# Patient Record
Sex: Female | Born: 1965 | Race: White | Hispanic: No | Marital: Single
Health system: Southern US, Community
[De-identification: ages and names within clinical notes are randomized; demographics above are authoritative.]

## PROBLEM LIST (undated history)

## (undated) DIAGNOSIS — J3089 Other allergic rhinitis: Secondary | ICD-10-CM

## (undated) DIAGNOSIS — F329 Major depressive disorder, single episode, unspecified: Secondary | ICD-10-CM

## (undated) DIAGNOSIS — F32A Depression, unspecified: Secondary | ICD-10-CM

## (undated) DIAGNOSIS — E785 Hyperlipidemia, unspecified: Secondary | ICD-10-CM

## (undated) HISTORY — PX: TONSILLECTOMY: SUR1361

---
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
## 2014-05-21 ENCOUNTER — Ambulatory Visit (HOSPITAL_COMMUNITY): Payer: Self-pay | Admitting: Physician Assistant

## 2014-06-04 ENCOUNTER — Ambulatory Visit (HOSPITAL_COMMUNITY): Payer: Self-pay | Admitting: Physician Assistant

## 2018-12-07 ENCOUNTER — Emergency Department
Admission: EM | Admit: 2018-12-07 | Discharge: 2018-12-07 | Disposition: A | Discharge status: Home / Self Care | Payer: 59 | Source: Home / Self Care | Attending: Family Medicine | Admitting: Family Medicine

## 2018-12-07 ENCOUNTER — Other Ambulatory Visit: Payer: Self-pay

## 2018-12-07 ENCOUNTER — Encounter: Payer: Self-pay | Admitting: Emergency Medicine

## 2018-12-07 ENCOUNTER — Emergency Department (INDEPENDENT_AMBULATORY_CARE_PROVIDER_SITE_OTHER): Payer: 59

## 2018-12-07 DIAGNOSIS — M79641 Pain in right hand: Secondary | ICD-10-CM

## 2018-12-07 DIAGNOSIS — M778 Other enthesopathies, not elsewhere classified: Secondary | ICD-10-CM | POA: Diagnosis not present

## 2018-12-07 HISTORY — DX: Hyperlipidemia, unspecified: E78.5

## 2018-12-07 HISTORY — DX: Other allergic rhinitis: J30.89

## 2018-12-07 HISTORY — DX: Depression, unspecified: F32.A

## 2018-12-07 MED ORDER — PREDNISONE 20 MG PO TABS: ORAL_TABLET | ORAL | 0 refills | Status: AC

## 2018-12-07 NOTE — ED Provider Notes (Signed)
[facility]    CSN: [id number] Arrival date & time: 12/07/18  0908      History   Chief Complaint Chief Complaint  Patient presents with  . Hand Injury    right    HPI Gina Goodman is a 53 y.o. female.   Patient has developed vague pain in the dorsum of her right hand recently.  Yesterday while twisting on the gasoline cap of her car, she felt increased pain in the dorsum of her right hand that has persisted.    The history is provided by the patient.  Hand Pain This is a new problem. The current episode started yesterday. The problem occurs constantly. The problem has been gradually worsening. Exacerbated by: movement of fingers. Nothing relieves the symptoms. Treatments tried: Ibuprofen 400mg . The treatment provided mild relief.    Past Medical History:  Diagnosis Date  . Depression   . Environmental and seasonal allergies   . Hyperlipidemia     There are no active problems to display for this patient.   Past Surgical History:  Procedure Laterality Date  . CESAREAN SECTION    . TONSILLECTOMY      OB History   No obstetric history on file.      Home Medications    Prior to Admission medications   Medication Sig Start Date End Date Taking? Authorizing Provider  atorvastatin (LIPITOR) 10 MG tablet Take 20 mg by mouth daily.   Yes [provider]  busPIRone (BUSPAR) 10 MG tablet Take 10 mg by mouth 3 (three) times daily.   Yes [provider]  fexofenadine (ALLEGRA) 60 MG tablet Take 60 mg by mouth 2 (two) times daily.   Yes [provider]  meloxicam (MOBIC) 7.5 MG tablet Take 7.5 mg by mouth daily.   Yes [provider]  sertraline (ZOLOFT) 100 MG tablet Take 100 mg by mouth daily.   Yes [provider]  predniSONE (DELTASONE) 20 MG tablet Take one tab by mouth twice daily for 4 days, then one daily for 3 days. Take with food. 12/07/18   Kandra Nicolas, MD    Family History No family history  on file.  Social History Social History   Tobacco Use  . Smoking status: Not on file  Substance Use Topics  . Alcohol use: Not on file  . Drug use: Not on file     Allergies   Septra [sulfamethoxazole-trimethoprim]   Review of Systems Review of Systems  Constitutional: Negative.   Musculoskeletal:       Right dorsal hand pain.  Skin: Negative for color change, rash and wound.  All other systems reviewed and are negative.    Physical Exam Triage Vital Signs ED Triage Vitals  Enc Vitals Group     BP 12/07/18 1021 (!) 150/84     Pulse Rate 12/07/18 1021 91     Resp 12/07/18 1021 18     Temp 12/07/18 1021 99.2 F (37.3 C)     Temp Source 12/07/18 1021 Oral     SpO2 12/07/18 1021 95 %     Weight 12/07/18 1022 210 lb (95.3 kg)     Height 12/07/18 1022 5\' 5"  (1.651 m)     Head Circumference --      Peak Flow --      Pain Score 12/07/18 1022 4     Pain Loc --      Pain Edu? --      Excl. in [place]? --  No data found.  Updated Vital Signs BP (!) 150/84 (BP Location: Left Arm)   Pulse 91   Temp 99.2 F (37.3 C) (Oral)   Resp 18   Ht 5\' 5"  (1.651 m)   Wt 95.3 kg   SpO2 95%   BMI 34.95 kg/m   Visual Acuity Right Eye Distance:   Left Eye Distance:   Bilateral Distance:    Right Eye Near:   Left Eye Near:    Bilateral Near:     Physical Exam Vitals signs and nursing note reviewed.  Constitutional:      General: She is not in acute distress.    Appearance: She is obese.  Eyes:     Pupils: Pupils are equal, round, and reactive to light.  Cardiovascular:     Rate and Rhythm: Normal rate.  Pulmonary:     Effort: Pulmonary effort is normal.  Musculoskeletal:     Right hand: She exhibits decreased range of motion and tenderness.       Hands:     Comments: Dorsum of right hand has distinct tenderness to palpation over the Extensor Indicis.  Patient has pain with extension of the second finger.  Distal neurovascular function is intact.   Skin:     General: Skin is warm and dry.  Neurological:     Mental Status: She is alert.      UC Treatments / Results  Labs (all labs ordered are listed, but only abnormal results are displayed) Labs Reviewed - No data to display  EKG   Radiology Dg Hand Complete Right  Result Date: 12/07/2018 CLINICAL DATA:  Right hand pain following an injury. EXAM: RIGHT HAND - COMPLETE 3+ VIEW COMPARISON:  None. FINDINGS: There is no evidence of fracture or dislocation. There is no evidence of arthropathy or other focal bone abnormality. Soft tissues are unremarkable. IMPRESSION: Normal examination. Electronically Signed   By: 13/07/2018 M.D.   On: 12/07/2018 10:32    Procedures Procedures (including critical care time)  Medications Ordered in UC Medications - No data to display  Initial Impression / Assessment and Plan / UC Course  I have reviewed the triage vital signs and the nursing notes.  Pertinent labs & imaging results that were available during my care of the patient were reviewed by me and considered in my medical decision making (see chart for details).    Begin prednisone burst/taper. Followup with Dr. 13/07/2018 ([facility]) if not improving about two weeks.    Final Clinical Impressions(s) / UC Diagnoses   Final diagnoses:  Right hand tendonitis     Discharge Instructions     Apply ice pack for 10 to 15 minutes, 3 to 4 times daily  Continue until pain and swelling decrease.  May take Tylenol if needed for pain. Begin hand exercises:  Do 5 reps of each exercise 3 times daily. Clenched wrist bend Side to side wrist bend Hand turn Finger curl Finger and thumb touch    ED Prescriptions    Medication Sig Dispense Auth. Provider   predniSONE (DELTASONE) 20 MG tablet Take one tab by mouth twice daily for 4 days, then one daily for 3 days. Take with food. 11 tablet Gina Langton, MD        Lattie Haw, MD 12/10/18 2230

## 2018-12-07 NOTE — ED Triage Notes (Signed)
Patient injured right hand yesterday while pumping gasoline. She has not taken an OTC today for the discomfort.  She has not had her influenza vacc this season. She has not travelled outside [place] past 4 weeks.

## 2018-12-07 NOTE — Discharge Instructions (Addendum)
Apply ice pack for 10 to 15 minutes, 3 to 4 times daily  Continue until pain and swelling decrease.  May take Tylenol if needed for pain. Begin hand exercises:  Do 5 reps of each exercise 3 times daily. Clenched wrist bend Side to side wrist bend Hand turn Finger curl Finger and thumb touch

## 2020-08-12 IMAGING — DX DG HAND COMPLETE 3+V*R*
3 series · 3 of 3 positions shown · non-contrast
Comparison: None.

CLINICAL DATA: Right hand pain following an injury.

EXAM:
RIGHT HAND - COMPLETE 3+ VIEW

[hand pa]
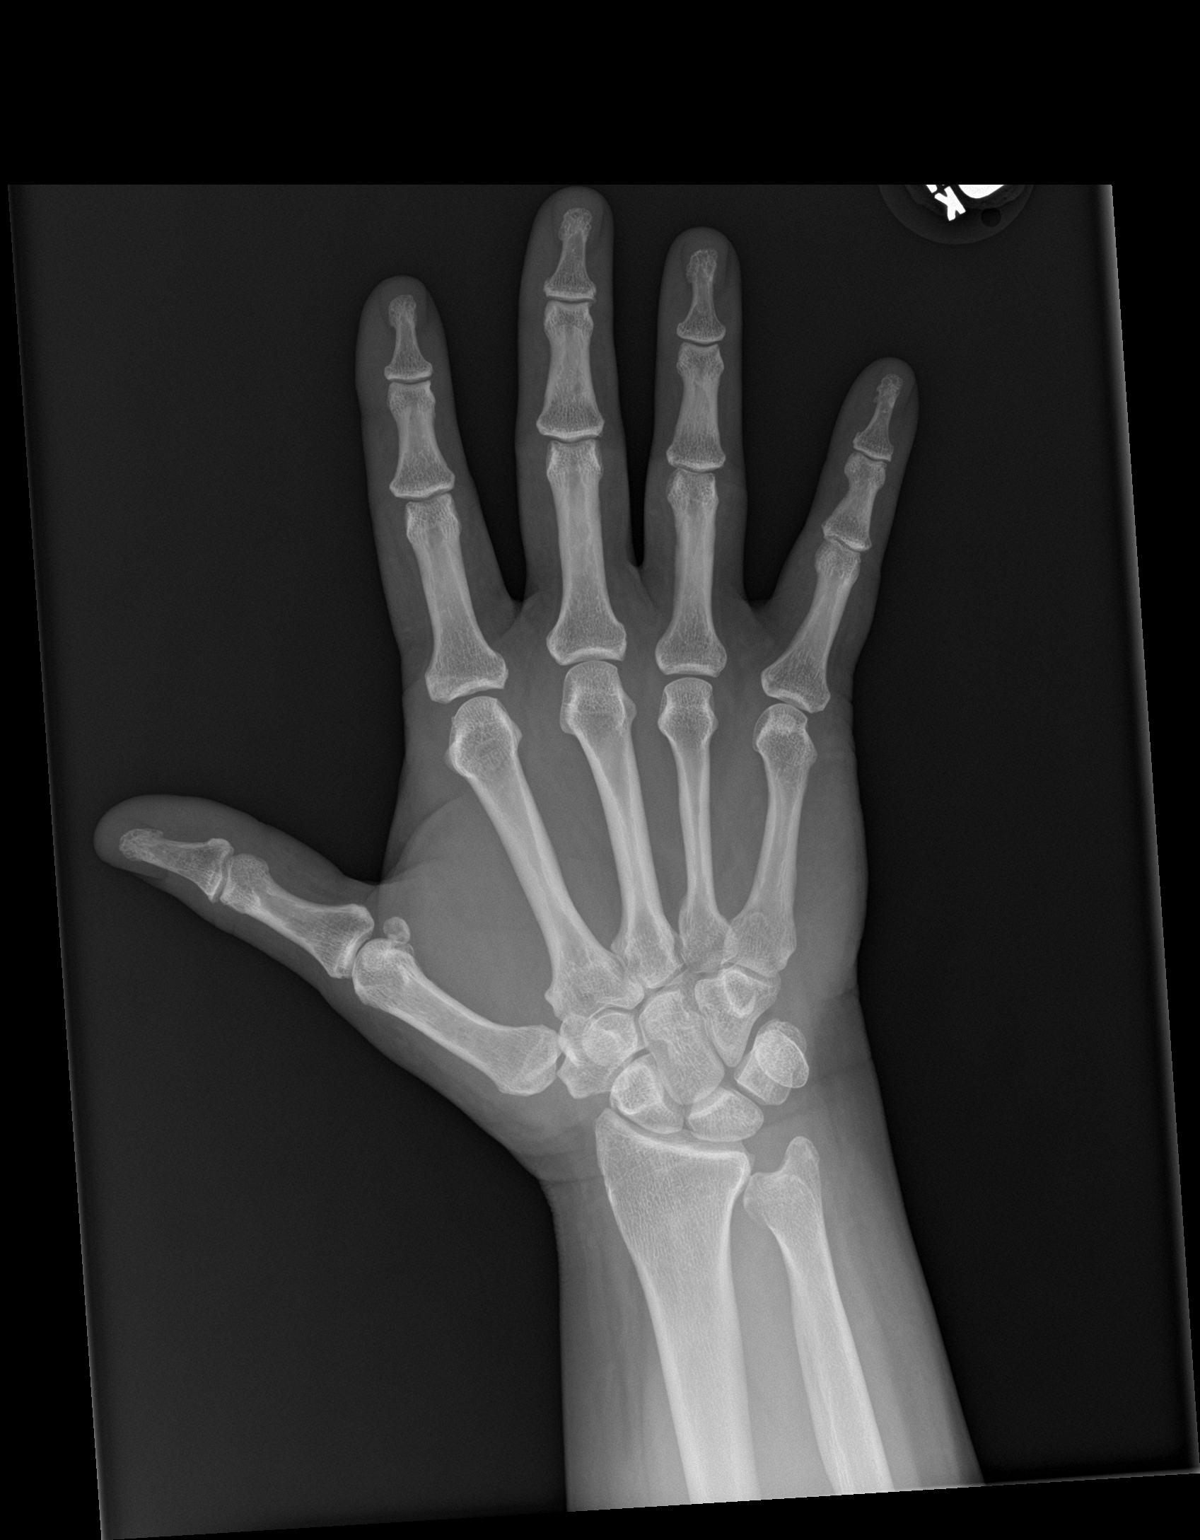

[hand obl]
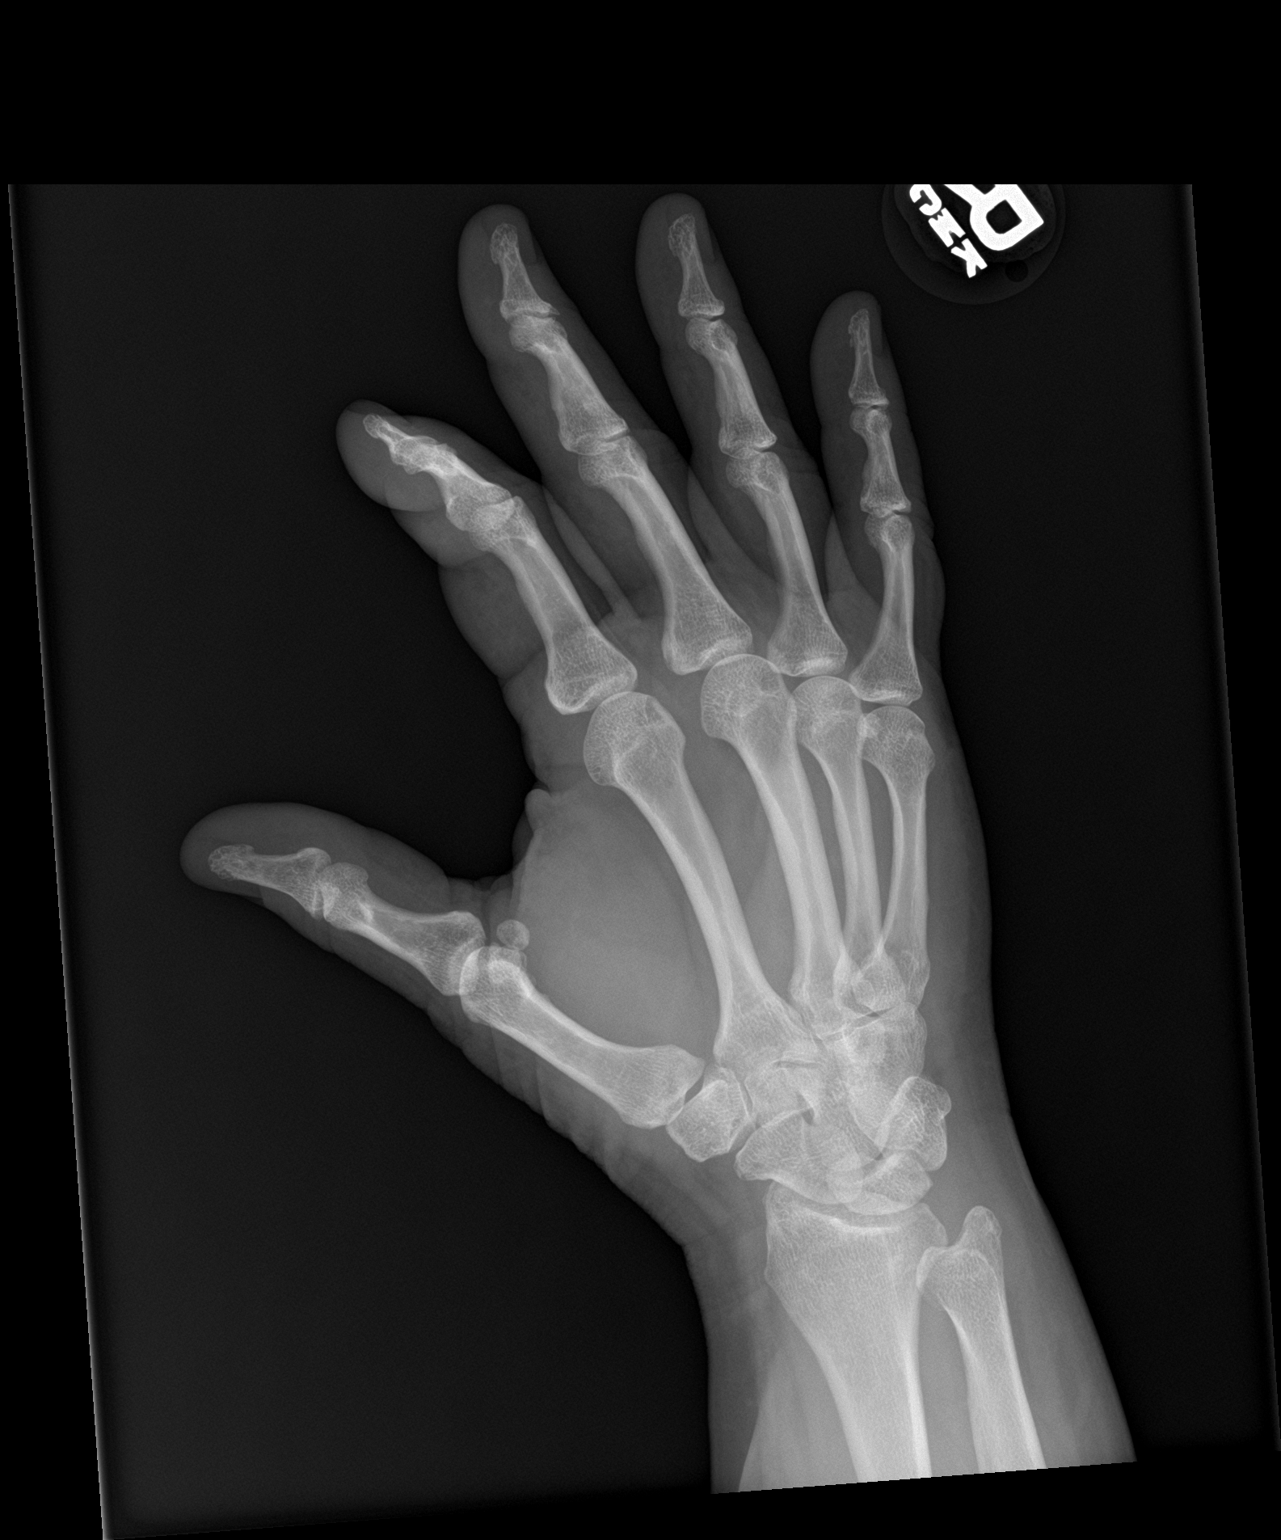

[hand lat]
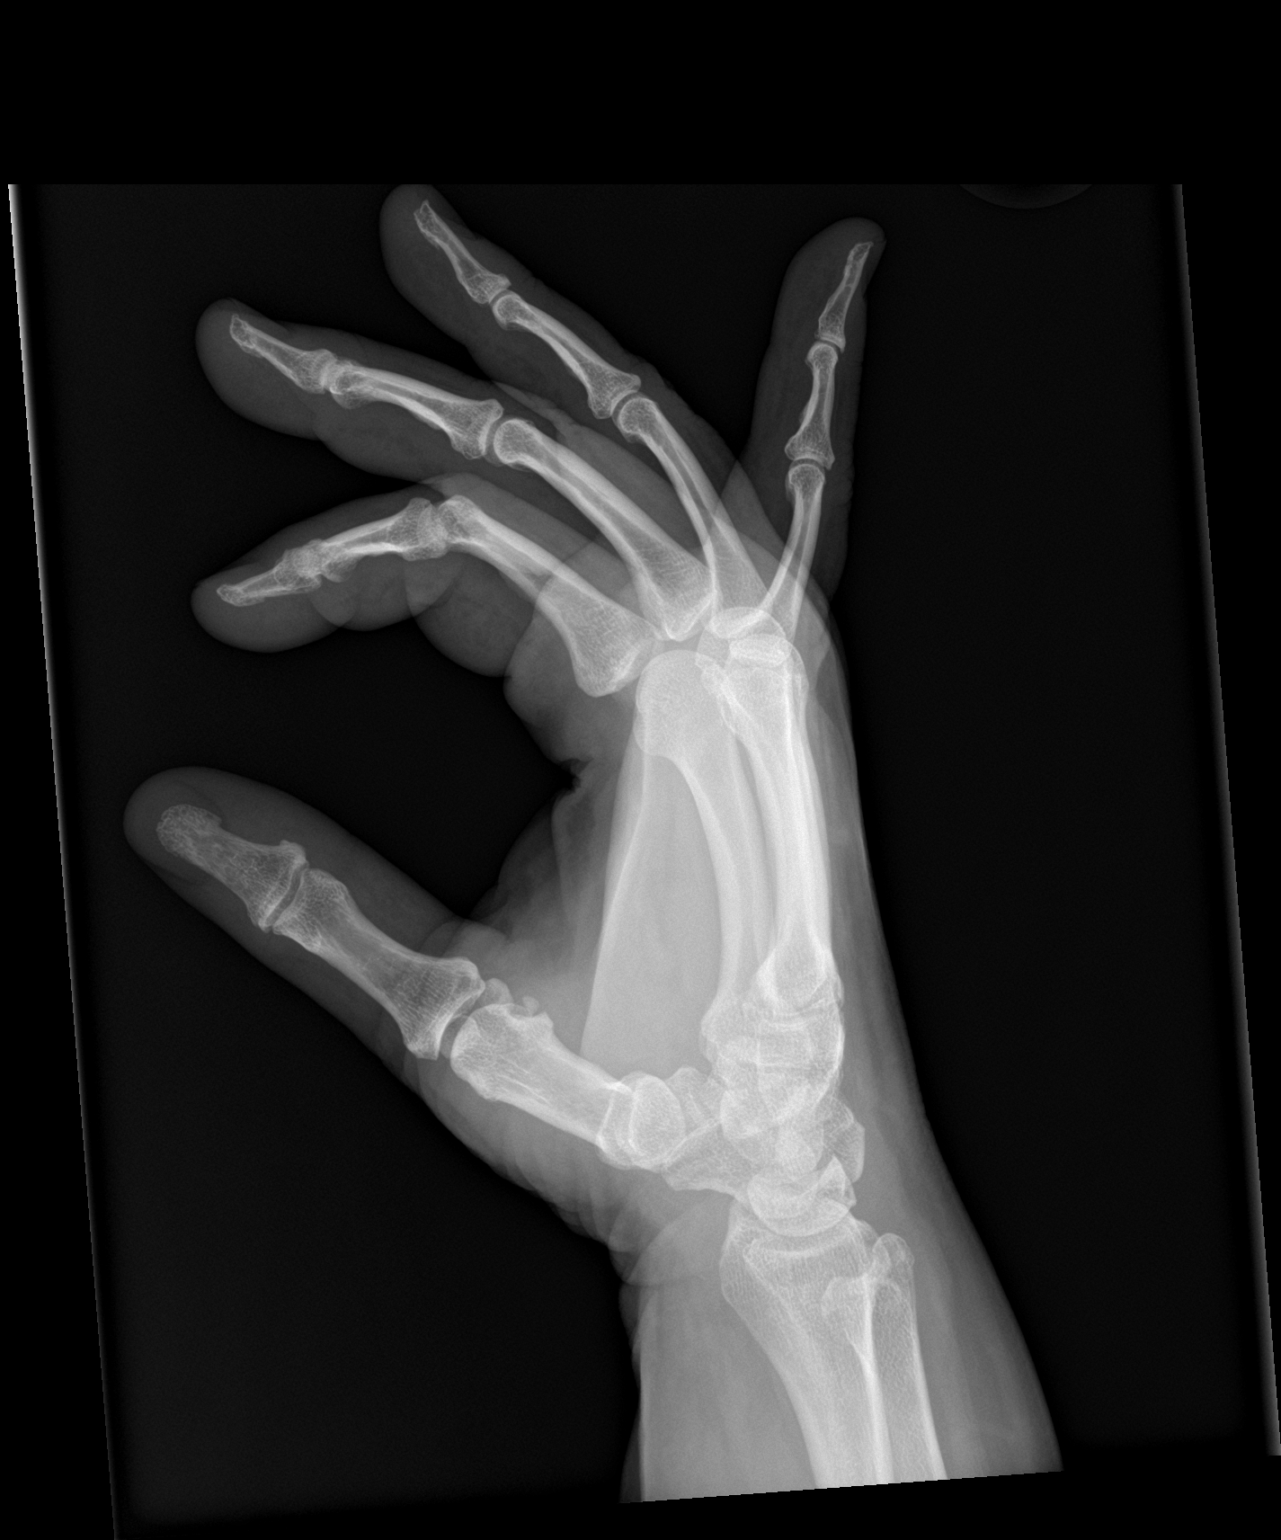

[3 of 3 positions shown; findings below may reference images not displayed]

FINDINGS: There is no evidence of fracture or dislocation. There is no
evidence of arthropathy or other focal bone abnormality. Soft
tissues are unremarkable.
IMPRESSION: Normal examination.
